# Patient Record
Sex: Male | Born: 1990 | Race: White | Hispanic: No | Marital: Single | Smoking: Never smoker
Health system: Southern US, Community
[De-identification: ages and names within clinical notes are randomized; demographics above are authoritative.]

## PROBLEM LIST (undated history)

## (undated) DIAGNOSIS — N135 Crossing vessel and stricture of ureter without hydronephrosis: Secondary | ICD-10-CM

## (undated) HISTORY — PX: KIDNEY SURGERY: SHX687

## (undated) HISTORY — PX: WISDOM TOOTH EXTRACTION: SHX21

---
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
## 2010-12-20 ENCOUNTER — Other Ambulatory Visit (HOSPITAL_COMMUNITY): Payer: Self-pay | Admitting: Urology

## 2010-12-20 DIAGNOSIS — N135 Crossing vessel and stricture of ureter without hydronephrosis: Secondary | ICD-10-CM

## 2010-12-27 ENCOUNTER — Inpatient Hospital Stay (HOSPITAL_COMMUNITY)
Admission: RE | Admit: 2010-12-27 | Discharge: 2010-12-27 | Payer: Self-pay | Source: Ambulatory Visit | Attending: Urology | Admitting: Urology

## 2011-01-01 ENCOUNTER — Ambulatory Visit (HOSPITAL_COMMUNITY)
Admission: RE | Admit: 2011-01-01 | Discharge: 2011-01-01 | Disposition: A | Discharge status: Home / Self Care | Payer: BC Managed Care – PPO | Source: Ambulatory Visit | Attending: Urology | Admitting: Urology

## 2011-01-01 ENCOUNTER — Encounter (HOSPITAL_COMMUNITY): Payer: Self-pay

## 2011-01-01 DIAGNOSIS — N135 Crossing vessel and stricture of ureter without hydronephrosis: Secondary | ICD-10-CM | POA: Insufficient documentation

## 2011-01-01 HISTORY — DX: Crossing vessel and stricture of ureter without hydronephrosis: N13.5

## 2011-01-01 MED ORDER — TECHNETIUM TC 99M MERTIATIDE
14.7000 | Freq: Once | INTRAVENOUS | Status: AC | PRN
  Administered 2011-01-01: 14.7 via INTRAVENOUS

## 2011-01-01 MED ORDER — FUROSEMIDE 10 MG/ML IJ SOLN: 33.0000 mg/kg | Freq: Once | INTRAMUSCULAR | Status: DC

## 2011-01-30 ENCOUNTER — Ambulatory Visit (HOSPITAL_COMMUNITY)
Admission: RE | Admit: 2011-01-30 | Discharge: 2011-01-30 | Disposition: A | Discharge status: Home / Self Care | Payer: BC Managed Care – PPO | Source: Ambulatory Visit | Attending: Urology | Admitting: Urology

## 2011-01-30 ENCOUNTER — Encounter (HOSPITAL_COMMUNITY): Payer: BC Managed Care – PPO

## 2011-01-30 ENCOUNTER — Other Ambulatory Visit: Payer: Self-pay | Admitting: Urology

## 2011-01-30 ENCOUNTER — Other Ambulatory Visit: Payer: Self-pay

## 2011-01-30 ENCOUNTER — Other Ambulatory Visit (HOSPITAL_COMMUNITY): Payer: Self-pay | Admitting: Urology

## 2011-01-30 DIAGNOSIS — Z01818 Encounter for other preprocedural examination: Secondary | ICD-10-CM | POA: Insufficient documentation

## 2011-01-30 DIAGNOSIS — N135 Crossing vessel and stricture of ureter without hydronephrosis: Secondary | ICD-10-CM | POA: Insufficient documentation

## 2011-01-30 DIAGNOSIS — I10 Essential (primary) hypertension: Secondary | ICD-10-CM | POA: Insufficient documentation

## 2011-01-30 DIAGNOSIS — Z01810 Encounter for preprocedural cardiovascular examination: Secondary | ICD-10-CM | POA: Insufficient documentation

## 2011-01-30 DIAGNOSIS — Z01811 Encounter for preprocedural respiratory examination: Secondary | ICD-10-CM | POA: Insufficient documentation

## 2011-01-30 DIAGNOSIS — Z01812 Encounter for preprocedural laboratory examination: Secondary | ICD-10-CM | POA: Insufficient documentation

## 2011-01-30 LAB — CBC
MCH: 31.1 pg (ref 26.0–34.0)
MCHC: 34 g/dL (ref 30.0–36.0)
RDW: 12.3 % (ref 11.5–15.5)

## 2011-01-30 LAB — BASIC METABOLIC PANEL
CO2: 28 mEq/L (ref 19–32)
Calcium: 10.4 mg/dL (ref 8.4–10.5)
Creatinine, Ser: 1.16 mg/dL (ref 0.4–1.5)
GFR calc Af Amer: >60 mL/min (ref >60)
GFR calc non Af Amer: >60 mL/min (ref >60)
Sodium: 142 mEq/L (ref 135–145)

## 2011-01-30 LAB — TYPE AND SCREEN: Antibody Screen: NEGATIVE

## 2011-01-31 ENCOUNTER — Inpatient Hospital Stay (HOSPITAL_COMMUNITY): Payer: BC Managed Care – PPO

## 2011-01-31 ENCOUNTER — Other Ambulatory Visit: Payer: Self-pay | Admitting: Urology

## 2011-01-31 ENCOUNTER — Inpatient Hospital Stay (HOSPITAL_COMMUNITY)
Admission: RE | Admit: 2011-01-31 | Discharge: 2011-02-01 | DRG: 304 | Disposition: A | Discharge status: Home / Self Care | Payer: BC Managed Care – PPO | Source: Ambulatory Visit | Attending: Urology | Admitting: Urology

## 2011-01-31 DIAGNOSIS — N1330 Unspecified hydronephrosis: Secondary | ICD-10-CM | POA: Diagnosis present

## 2011-01-31 DIAGNOSIS — N135 Crossing vessel and stricture of ureter without hydronephrosis: Principal | ICD-10-CM | POA: Diagnosis present

## 2011-01-31 DIAGNOSIS — I10 Essential (primary) hypertension: Secondary | ICD-10-CM | POA: Diagnosis present

## 2011-01-31 LAB — BASIC METABOLIC PANEL
BUN: 17 mg/dL (ref 6–23)
Chloride: 105 mEq/L (ref 96–112)
GFR calc non Af Amer: >60 mL/min (ref >60)
Glucose, Bld: 120 mg/dL — ABNORMAL HIGH (ref 70–99)
Potassium: 4.1 mEq/L (ref 3.5–5.1)
Sodium: 140 mEq/L (ref 135–145)

## 2011-01-31 LAB — HEMOGLOBIN AND HEMATOCRIT, BLOOD
HCT: 42.5 % (ref 39.0–52.0)
Hemoglobin: 14.8 g/dL (ref 13.0–17.0)

## 2011-02-01 LAB — BASIC METABOLIC PANEL
Calcium: 9.1 mg/dL (ref 8.4–10.5)
GFR calc Af Amer: >60 mL/min (ref >60)
GFR calc non Af Amer: >60 mL/min (ref >60)
Potassium: 4.8 mEq/L (ref 3.5–5.1)
Sodium: 139 mEq/L (ref 135–145)

## 2011-02-01 LAB — HEMOGLOBIN AND HEMATOCRIT, BLOOD: Hemoglobin: 13.7 g/dL (ref 13.0–17.0)

## 2011-02-01 LAB — CREATININE, FLUID (PLEURAL, PERITONEAL, JP DRAINAGE)

## 2011-02-03 NOTE — Op Note (Signed)
[name], Andrade NO.:  [id number]  MEDICAL RECORD NO.:  [id number]           PATIENT TYPE:  I  LOCATION:  1408                         FACILITY:  [facility]  PHYSICIAN:  Heloise Purpura, MD      DATE OF BIRTH:  02/20/1991  DATE OF PROCEDURE:  01/31/2011 DATE OF DISCHARGE:                              OPERATIVE REPORT   PREOPERATIVE DIAGNOSIS:  Right ureteropelvic junction obstruction.  POSTOPERATIVE DIAGNOSIS:  Right ureteropelvic junction obstruction.  PROCEDURES: 1. Cystoscopy. 2. Right retrograde pyelography with interpretation. 3. Right ureteral stent (6 x 24). 4. Right robotic-assisted laparoscopic dismembered pyeloplasty.  SURGEON:  Heloise Purpura, MD  ASSISTANT:  Delia Chimes, NP-C  ANESTHESIA:  General.  COMPLICATIONS:  None.  ESTIMATED BLOOD LOSS:  25 cc.  INTRAVENOUS FLUIDS:  2 L of lactated Ringer's.  SPECIMENS:  Right ureteropelvic junction.  DISPOSITION:  Specimen to Pathology.  DRAINS:1. #15 Blake perinephric drain. 2. 14-French Foley catheter.  INTRAOPERATIVE FINDINGS:  Retrograde pyelography demonstrated a normal caliber ureter without obvious evidence for filling defects or other abnormalities.  There was noted be a tortuous ureteropelvic junction, consistent with a probable crossing lower pole renal vessel.  He also had dilation of the renal pelvis and calyces, consistent with his chronic hydronephrosis.  No filling defects within the renal collecting system were identified.  Intraoperatively, the patient was noted to have a lower pole crossing renal vessel as the apparent etiology for his ureteropelvic junction obstruction.  INDICATION:  Andrade Andrade is a 20 year old male patient who has a history of intermittent right-sided flank pain and underwent an evaluation including a CT scan which demonstrated right-sided hydronephrosis without a dilated ureter and no evidence of urolithiasis.  His findings were most consistent with the  ureteropelvic junction obstruction and he underwent further evaluation with a nuclear medicine renal scan which helped to confirm his diagnosis and also demonstrated decreased relative renal function with approximately 17% split renal function from the right kidney.  I had a thorough discussion with him regarding management options for treatment.  We did discuss the possibility of nephrectomy versus pyeloplasty.  Considering his young age and the family history of end-stage renal disease, he did elect to undergo nephron preservation therapy and a robotic pyeloplasty.  We, therefore, reviewed the potential risks and complications associated with the above procedures and he agreed to proceed and gave his informed consent.  DESCRIPTION OF PROCEDURE:  The patient was taken to the operating room and a general anesthetic was administered.  He was given preoperative antibiotics, placed in the dorsal lithotomy position, and prepped and draped in the usual sterile fashion.  Next, a preoperative time-out was performed.  Cystourethroscopy revealed a normal anterior and posterior urethra.  Systematic examination of bladder revealed the ureteral orifices to be in the normal anatomic position and effluxing clear urine.  Specifically, no evidence of any bladder tumors, stones, or other mucosal pathology was identified.  The right ureteral orifice was then identified and intubated with a 6-French ureteral catheter. Omnipaque contrast was injected with findings dictated above.  He did appear to have a crossing renal vessels, the most likely etiology  for his ureteropelvic junction.  A 0.038 sensor guidewire was then advanced easily into the renal pelvis under fluoroscopic guidance.  An attempt was then made to pass an 8 x 26 double-J ureteral stent.  However, this was unsuccessful due to the narrow caliber of his distal ureter. Therefore, a 7 x 26 double-J ureteral stent was advanced over the wire with an  attempt to place a stent 1 size smaller.  Again, the patient's ureter was of such a small caliber that this stent would not pass easily.  Finally, a 6 x 26 double-J ureteral stent was advanced over the wire and passed easily into the renal pelvis without any resistance.  It was positioned appropriately under fluoroscopic and cystoscopic guidance and the wire was then removed with a good curl noted in the renal pelvis as well as in the bladder.  A 14-French Foley catheter was then placed and the patient was repositioned in the right modified flank position with care to pad all potential pressure points.  Another preoperative time-out was performed.  His abdomen was prepped and draped in the usual sterile fashion.  Site was then selected just to the right of the umbilicus for placement of the camera port which was placed using a standard open Hasson technique.  This allowed entry into the peritoneal cavity under direct vision without difficulty.  Inspection of the abdomen revealed no evidence of any intra-abdominal injuries or other abnormalities.  The remaining ports were then placed.  An 8 mm robotic ports were placed in the right upper quadrant, right lower quadrant, and far right lower quadrant.  A 12 mm port was placed in the upper midline just superior to the umbilicus for placement for laparoscopic assistance.  All ports were placed under direct vision and again without difficulty.  The surgical cart was then docked.  The ascending colon was then reflected medially and the space between the mesocolon and the anterior layer of Gerota's fascia was developed.  The patient did not have very much retroperitoneal fat and the ureter was immediately identified and able to be lifted anteriorly off the psoas muscle.  The ureter was then dissected free in a superior fashion and lower pole renal vessels were identified crossing anterior to the renal pelvis at the level of the ureteropelvic  junction.  Using a combination of blunt and sharp dissection, the proximal ureter and renal pelvis was able to be dissected free from the surrounding perinephric fat with care to preserve the periureteral blood supply.  Once the renal pelvis and ureter was freed from the overlying renal vessels, the ureter was divided just below the level of the ureteropelvic junction and the stent was brought into the operative field.  The renal pelvis was then brought anterior and transposed over the crossing lower pole renal vessels.  The ureteropelvic junction was then excised and sent for pathologic analysis.  This resulted in a widely patent and laterally spatulated renal pelvis.  The ureter appeared normal and with excellent blood supply.  It was spatulated medially in appropriate length.  A 4-0 Vicryl interrupted sutures were then placed at the lateral medial aspects of the renal pelvis and used to reapproximate the ureter to the renal pelvis in a fashion, resulting in dependent drainage.  The stent was placed back into the renal pelvis and running 4-0 Vicryl sutures were then used to complete the anastomosis both posteriorly and anteriorly. This appeared to result in a watertight and tension-free anastomosis with the ureter placed again in  a dependent portion of the renal pelvis. Hemostasis appeared excellent even with a pneumoperitoneum let down.  A #15 Blake drain was then brought through the far right lower quadrant port and positioned within the perinephric space.  It was secured to the skin with a nylon suture.  All remaining ports were then examined and the 12 mm port sites were closed with 0 Vicryl sutures placed laparoscopically.  The remaining ports were then removed under direct vision and the pneumoperitoneum was let down.  All port sites were then injected with 0.25% Marcaine and reapproximated at the skin level with 4- 0 Monocryl subcuticular closures.  Dermabond was applied to the  skin. He tolerated the procedure well without complications.  He was able to be extubated and transferred to the recovery unit in satisfactory condition.     Heloise Purpura, MD     LB/MEDQ  D:  01/31/2011  T:  01/31/2011  Job:  [id number]  Electronically Signed by Heloise Purpura MD on 02/03/2011 04:43:33 PM

## 2011-02-21 LAB — ABO/RH: ABO/RH(D): O POS

## 2011-06-07 ENCOUNTER — Other Ambulatory Visit (HOSPITAL_COMMUNITY): Payer: Self-pay | Admitting: Urology

## 2011-07-10 ENCOUNTER — Other Ambulatory Visit (HOSPITAL_COMMUNITY): Payer: BC Managed Care – PPO

## 2013-04-22 ENCOUNTER — Other Ambulatory Visit: Payer: Self-pay | Admitting: Dermatology

## 2013-05-10 ENCOUNTER — Other Ambulatory Visit: Payer: Self-pay | Admitting: Dermatology

## 2014-04-27 ENCOUNTER — Other Ambulatory Visit: Payer: Self-pay | Admitting: Dermatology

## 2014-09-02 ENCOUNTER — Other Ambulatory Visit (HOSPITAL_COMMUNITY): Payer: Self-pay | Admitting: Urology

## 2014-09-02 DIAGNOSIS — Q6239 Other obstructive defects of renal pelvis and ureter: Secondary | ICD-10-CM

## 2014-09-02 DIAGNOSIS — Q6211 Congenital occlusion of ureteropelvic junction: Principal | ICD-10-CM

## 2014-09-13 ENCOUNTER — Ambulatory Visit (HOSPITAL_COMMUNITY): Admission: RE | Admit: 2014-09-13 | Payer: BC Managed Care – PPO | Source: Ambulatory Visit

## 2014-09-15 ENCOUNTER — Encounter (HOSPITAL_COMMUNITY)
Admission: RE | Admit: 2014-09-15 | Discharge: 2014-09-15 | Disposition: A | Discharge status: Home / Self Care | Payer: BC Managed Care – PPO | Source: Ambulatory Visit | Attending: Urology | Admitting: Urology

## 2014-09-15 DIAGNOSIS — Q6211 Congenital occlusion of ureteropelvic junction: Secondary | ICD-10-CM | POA: Diagnosis not present

## 2014-09-15 DIAGNOSIS — Q6239 Other obstructive defects of renal pelvis and ureter: Secondary | ICD-10-CM

## 2014-09-15 MED ORDER — FUROSEMIDE 10 MG/ML IJ SOLN
40.0000 mg | Freq: Once | INTRAMUSCULAR | Status: AC
  Administered 2014-09-15: 40 mg via INTRAVENOUS
  Filled 2014-09-15: qty 4

## 2014-09-15 MED ORDER — TECHNETIUM TC 99M MERTIATIDE
15.5000 | Freq: Once | INTRAVENOUS | Status: AC | PRN
  Administered 2014-09-15: 15.5 via INTRAVENOUS

## 2016-06-01 ENCOUNTER — Emergency Department (HOSPITAL_BASED_OUTPATIENT_CLINIC_OR_DEPARTMENT_OTHER)
Admission: EM | Admit: 2016-06-01 | Discharge: 2016-06-01 | Disposition: A | Discharge status: Home / Self Care | Payer: Self-pay | Attending: Emergency Medicine | Admitting: Emergency Medicine

## 2016-06-01 ENCOUNTER — Emergency Department (HOSPITAL_BASED_OUTPATIENT_CLINIC_OR_DEPARTMENT_OTHER): Payer: Self-pay

## 2016-06-01 ENCOUNTER — Encounter (HOSPITAL_BASED_OUTPATIENT_CLINIC_OR_DEPARTMENT_OTHER): Payer: Self-pay | Admitting: Emergency Medicine

## 2016-06-01 DIAGNOSIS — R319 Hematuria, unspecified: Secondary | ICD-10-CM

## 2016-06-01 LAB — BASIC METABOLIC PANEL
Anion gap: 6 (ref 5–15)
BUN: 16 mg/dL (ref 6–20)
CALCIUM: 9.8 mg/dL (ref 8.9–10.3)
CO2: 28 mmol/L (ref 22–32)
CREATININE: 0.85 mg/dL (ref 0.61–1.24)
Chloride: 106 mmol/L (ref 101–111)
GFR calc non Af Amer: >60 mL/min (ref >60)
Glucose, Bld: 99 mg/dL (ref 65–99)
Potassium: 3.5 mmol/L (ref 3.5–5.1)
SODIUM: 140 mmol/L (ref 135–145)

## 2016-06-01 LAB — URINE MICROSCOPIC-ADD ON

## 2016-06-01 LAB — CBC WITH DIFFERENTIAL/PLATELET
BASOS PCT: 0 %
Basophils Absolute: 0 10*3/uL (ref 0.0–0.1)
EOS ABS: 0.2 10*3/uL (ref 0.0–0.7)
EOS PCT: 2 %
HCT: 42.4 % (ref 39.0–52.0)
HEMOGLOBIN: 14.7 g/dL (ref 13.0–17.0)
Lymphocytes Relative: 22 %
Lymphs Abs: 2.4 10*3/uL (ref 0.7–4.0)
MCH: 32.3 pg (ref 26.0–34.0)
MCHC: 34.7 g/dL (ref 30.0–36.0)
MCV: 93.2 fL (ref 78.0–100.0)
MONOS PCT: 12 %
Monocytes Absolute: 1.2 10*3/uL — ABNORMAL HIGH (ref 0.1–1.0)
NEUTROS PCT: 64 %
Neutro Abs: 6.7 10*3/uL (ref 1.7–7.7)
PLATELETS: 253 10*3/uL (ref 150–400)
RBC: 4.55 MIL/uL (ref 4.22–5.81)
RDW: 12.5 % (ref 11.5–15.5)
WBC: 10.6 10*3/uL — AB (ref 4.0–10.5)

## 2016-06-01 LAB — URINALYSIS, ROUTINE W REFLEX MICROSCOPIC
BILIRUBIN URINE: NEGATIVE
Glucose, UA: NEGATIVE mg/dL
KETONES UR: 15 mg/dL — AB
NITRITE: NEGATIVE
Protein, ur: 100 mg/dL — AB
Specific Gravity, Urine: 1.015 (ref 1.005–1.030)
pH: 6.5 (ref 5.0–8.0)

## 2016-06-01 NOTE — ED Provider Notes (Signed)
CSN: [id number]     Arrival date & time 06/01/16  7846 History   First MD Initiated Contact with Patient 06/01/16 1004     Chief Complaint  Patient presents with  . Flank Pain      Patient is a 25 y.o. male presenting with flank pain. The history is provided by the patient.  Flank Pain This is a new problem. Pertinent negatives include no chest pain, no abdominal pain, no headaches and no shortness of breath.  Patient presents with hematuria and right flank pain. Began this morning. The pain is dull. States he is not having difficulty urinating. Previous history of right-sided UPJ obstruction. States that repaired a few years ago with surgery. States this feels somewhat the same but not as severe. He did not have hematuria with that in the past. He states that the right kidney does about 25% of his kidney function.  Past Medical History  Diagnosis Date  . UPJ obstruction, acquired    Past Surgical History  Procedure Laterality Date  . Kidney surgery Right    No family history on file. Social History  Substance Use Topics  . Smoking status: Never Smoker   . Smokeless tobacco: None  . Alcohol Use: Yes    Review of Systems  Constitutional: Negative for activity change and appetite change.  HENT: Negative for congestion.   Eyes: Negative for pain.  Respiratory: Negative for chest tightness and shortness of breath.   Cardiovascular: Negative for chest pain and leg swelling.  Gastrointestinal: Negative for nausea, vomiting, abdominal pain and diarrhea.  Genitourinary: Positive for hematuria and flank pain.  Musculoskeletal: Negative for back pain and neck stiffness.  Skin: Negative for rash.  Neurological: Negative for weakness, numbness and headaches.  Psychiatric/Behavioral: Negative for behavioral problems.      Allergies  Penicillins  Home Medications   Prior to Admission medications   Not on File   BP 126/96 mmHg  Pulse 84  Temp(Src) 97.5 F (36.4 C) (Oral)   Resp 16  Ht  (1.753 m)  Wt 170 lb (77.111 kg)  BMI 25.09 kg/m2  SpO2 100% Physical Exam  Constitutional: He is oriented to person, place, and time. He appears well-developed and well-nourished.  HENT:  Head: Normocephalic and atraumatic.  Eyes: Pupils are equal, round, and reactive to light.  Cardiovascular: Normal rate, regular rhythm and normal heart sounds.   No murmur heard. Pulmonary/Chest: Effort normal and breath sounds normal.  Abdominal: Soft. Bowel sounds are normal. He exhibits no distension and no mass. There is no tenderness. There is no rebound and no guarding.  Genitourinary:  No CVA tenderness.  Musculoskeletal: Normal range of motion. He exhibits no edema.  Neurological: He is alert and oriented to person, place, and time. No cranial nerve deficit.  Skin: Skin is warm and dry.  Psychiatric: He has a normal mood and affect.  Nursing note and vitals reviewed.   ED Course  Procedures (including critical care time) Labs Review Labs Reviewed  URINALYSIS, ROUTINE W REFLEX MICROSCOPIC (NOT AT [facility]) - Abnormal; Notable for the following:    Color, Urine RED (*)    APPearance CLOUDY (*)    Hgb urine dipstick LARGE (*)    Ketones, ur 15 (*)    Protein, ur 100 (*)    Leukocytes, UA SMALL (*)    All other components within normal limits  CBC WITH DIFFERENTIAL/PLATELET - Abnormal; Notable for the following:    WBC 10.6 (*)    Monocytes  Absolute 1.2 (*)    All other components within normal limits  URINE MICROSCOPIC-ADD ON - Abnormal; Notable for the following:    Squamous Epithelial / LPF 0-5 (*)    Bacteria, UA RARE (*)    All other components within normal limits  BASIC METABOLIC PANEL    Imaging Review Ct Renal Stone Study  06/01/2016  CLINICAL DATA:  Right flank pain and hematuria EXAM: CT ABDOMEN AND PELVIS WITHOUT CONTRAST TECHNIQUE: Multidetector CT imaging of the abdomen and pelvis was performed following the standard protocol without IV contrast.  COMPARISON:  None FINDINGS: Lower chest: The lung bases are clear. No pleural or pericardial effusion. Hepatobiliary: No focal liver abnormality identified. Gallbladder appears normal. No biliary dilatation. Pancreas: The pancreas is unremarkable. Spleen: The spleen is negative. Adrenals/Urinary Tract: Normal appearance of the adrenal glands. Numerous bilateral renal cysts of varying density are identified and incompletely characterized without IV contrast. There is asymmetric cortical scarring and atrophy of the right kidney. Tiny bilateral renal calculi are noted. No hydronephrosis or hydroureter identified. No ureteral lithiasis identified. The urinary bladder appears normal. Stomach/Bowel: The stomach appears normal. The small bowel loops have a normal course and caliber without evidence for bowel obstruction. The colon is unremarkable. The appendix is visualized and appears normal. Vascular/Lymphatic: Normal appearance of the abdominal aorta. No enlarged retroperitoneal or mesenteric adenopathy. No enlarged pelvic or inguinal lymph nodes. Reproductive: No mass or other significant abnormality. Other: None. Musculoskeletal: No aggressive lytic or sclerotic bone lesions identified. 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 IMPRESSION: 1. No acute findings identified within the abdomen or pelvis. 2. Asymmetric right renal cortical atrophy and scarring. 3. Bilateral renal cysts of varying density are identified an incompletely characterized without IV contrast. 4. Tiny bilateral renal calculi identified. No hydronephrosis or hydroureter identified. Electronically Signed   By: Signa Kellaylor  Stroud M.D.   On: 06/01/2016 13:13   I have personally reviewed and evaluated these images and lab results as part of my medical decision-making.   EKG Interpretation None      MDM   Final diagnoses:  Hematuria    Patient with hematuria. 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  Labs reassuring. CT scan done and did not show a clear cause but does have some bilateral kidney stones. Will discharge home to follow with his urologist.    Benjiman CoreNathan Aaliah Jorgenson, MD 06/01/16 1425

## 2016-06-01 NOTE — ED Notes (Signed)
R flank pain, non radiating started this morning. Pt also reports hematuria. Denies N/V.

## 2016-06-01 NOTE — Discharge Instructions (Signed)

## 2017-11-19 IMAGING — CT CT RENAL STONE PROTOCOL
2 of 4 series · 16 of 46 positions shown, 18 images · non-contrast
Comparison: None

CLINICAL DATA: Right flank pain and hematuria

EXAM:
CT ABDOMEN AND PELVIS WITHOUT CONTRAST
TECHNIQUE: Multidetector CT imaging of the abdomen and pelvis was performed
following the standard protocol without IV contrast.

[Series 2: axial st · axial · 0.74mm/px · z∈[+637,+1057]mm · 13 of 92 slices shown, 15 images]
[im 4/92  soft-tissue]
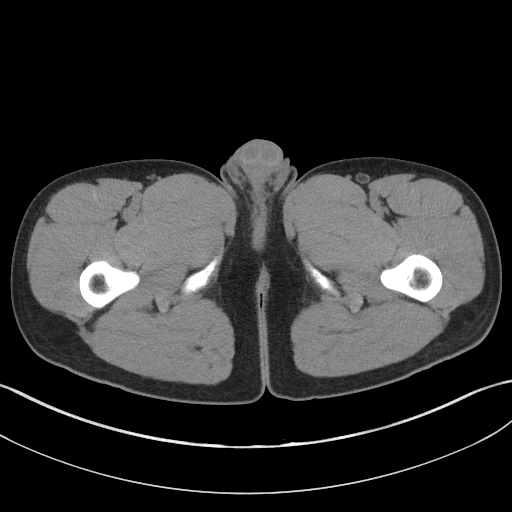
[im 4/92  bone]
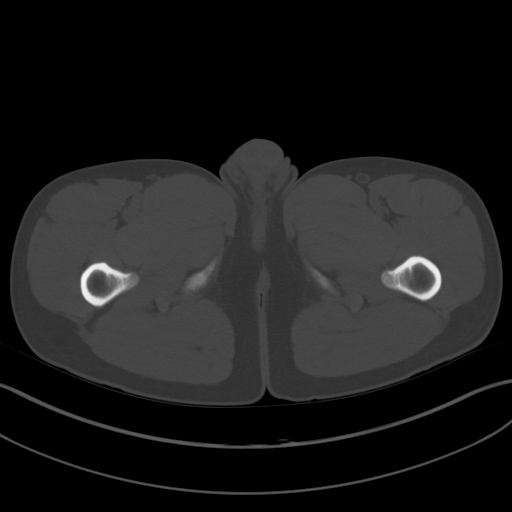
[im 12/92  soft-tissue]
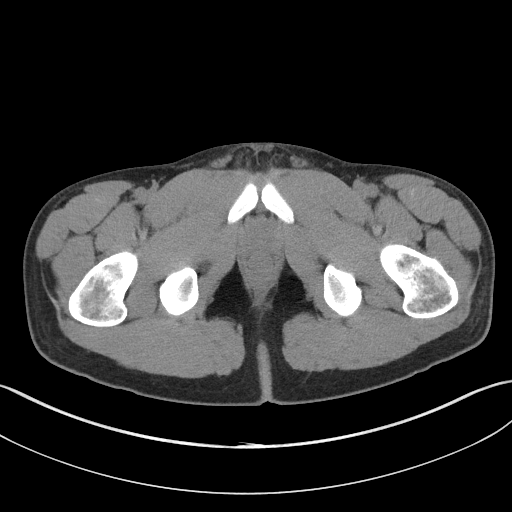
[im 19/92  soft-tissue]
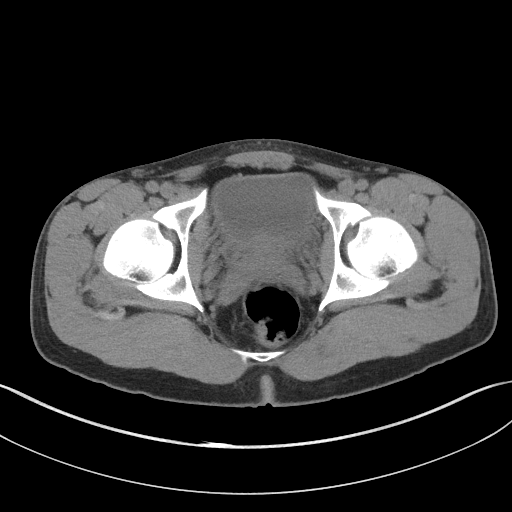
[im 27/92  soft-tissue]
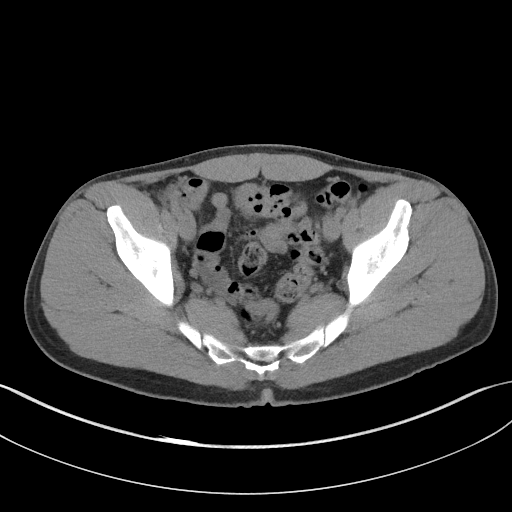
[im 31/92  soft-tissue]
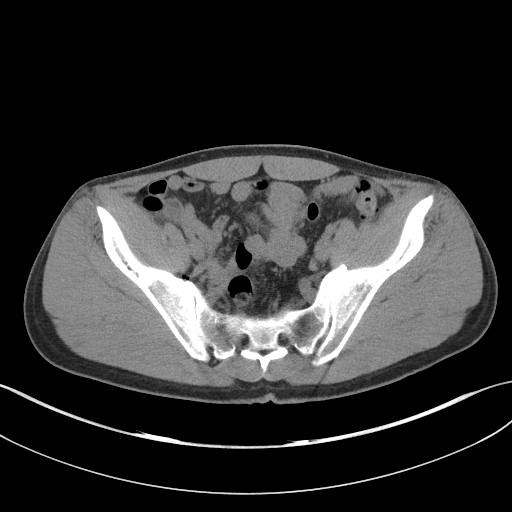
[im 38/92  soft-tissue]
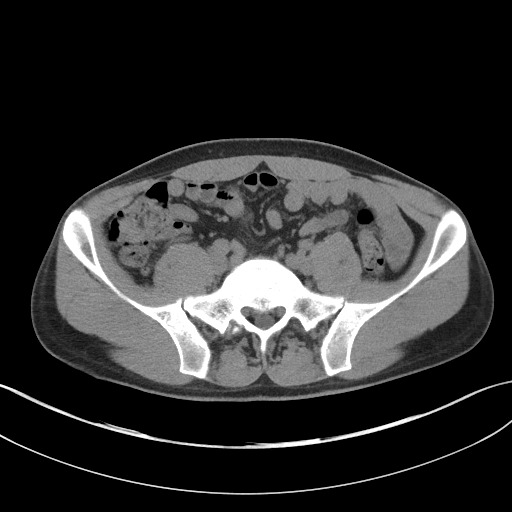
[im 46/92  soft-tissue]
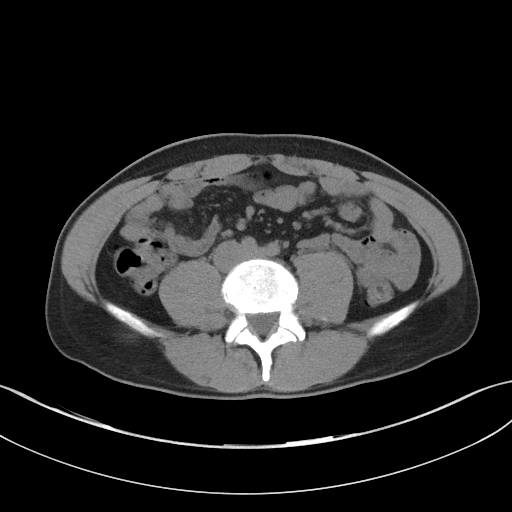
[im 54/92  soft-tissue]
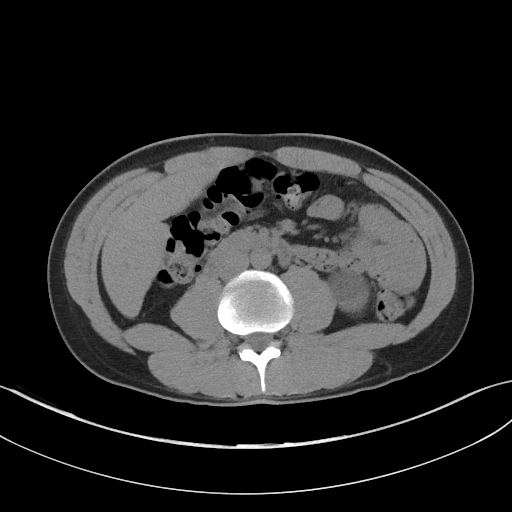
[im 61/92  soft-tissue]
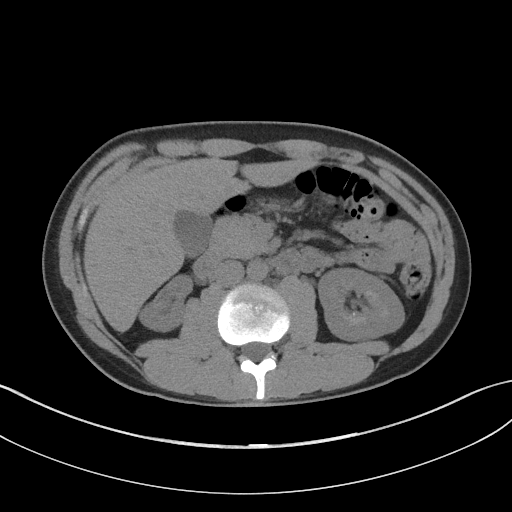
[im 61/92  bone]
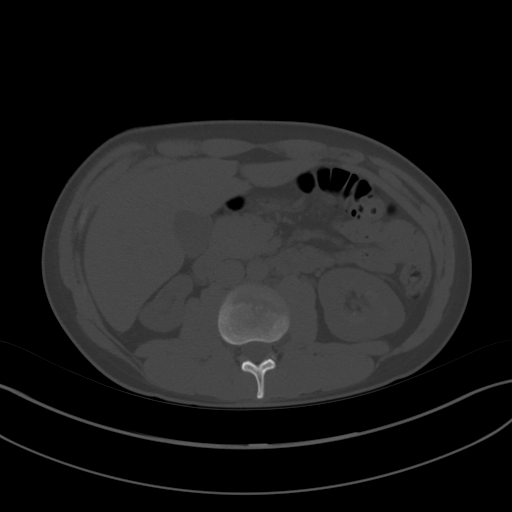
[im 65/92  soft-tissue]
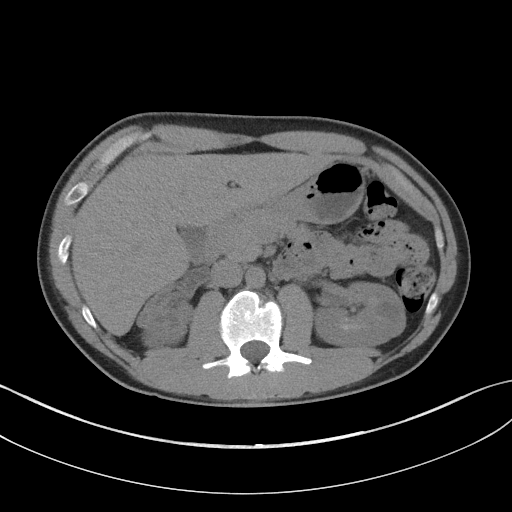
[im 73/92  soft-tissue]
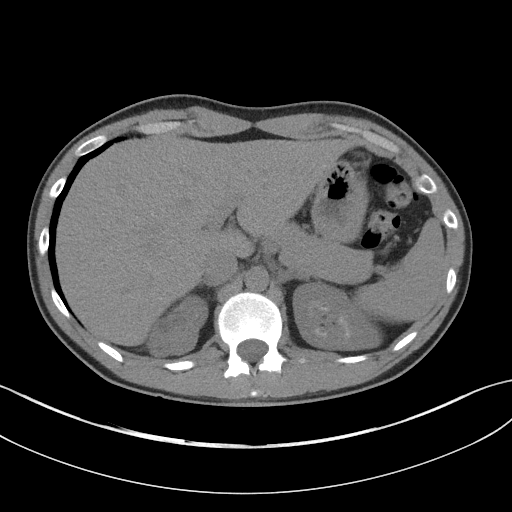
[im 80/92  soft-tissue]
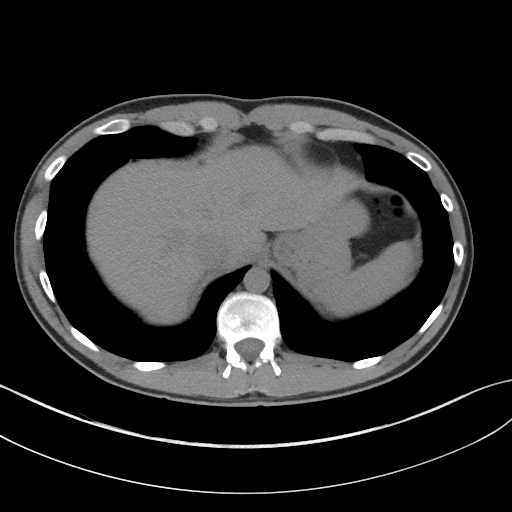
[im 88/92  soft-tissue]
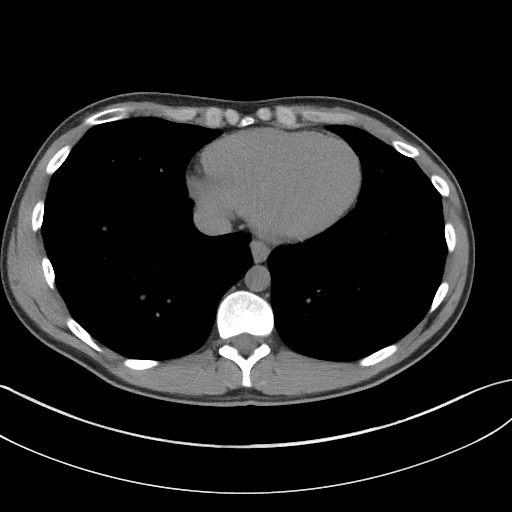

[Series 5: coronal st · coronal · 0.71mm/px · 3 of 72 slices shown]
[im 24/72  soft-tissue]
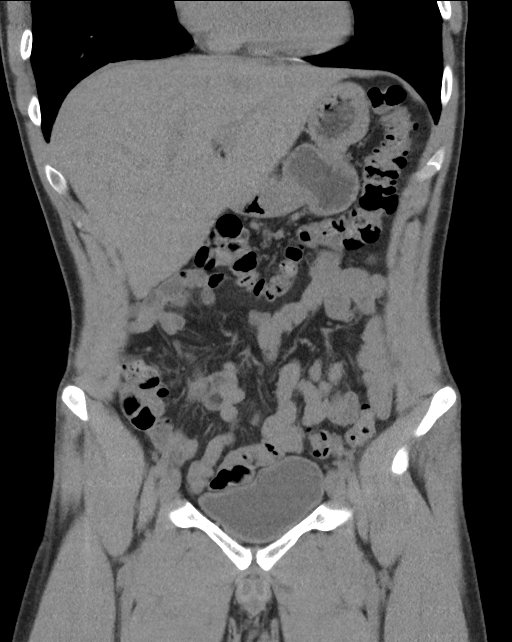
[im 32/72  soft-tissue]
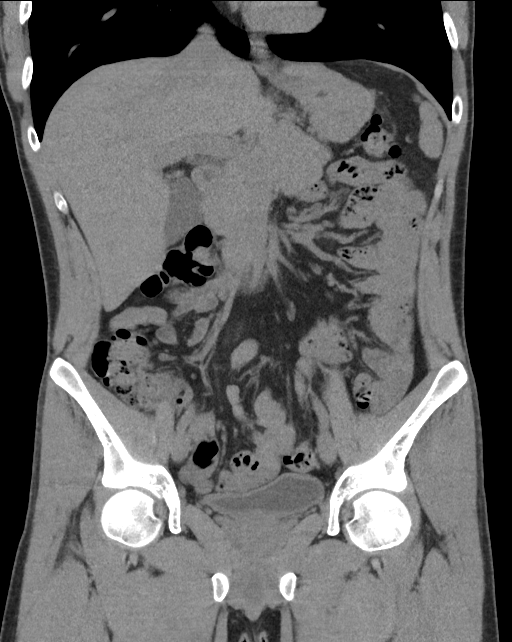
[im 40/72  soft-tissue]
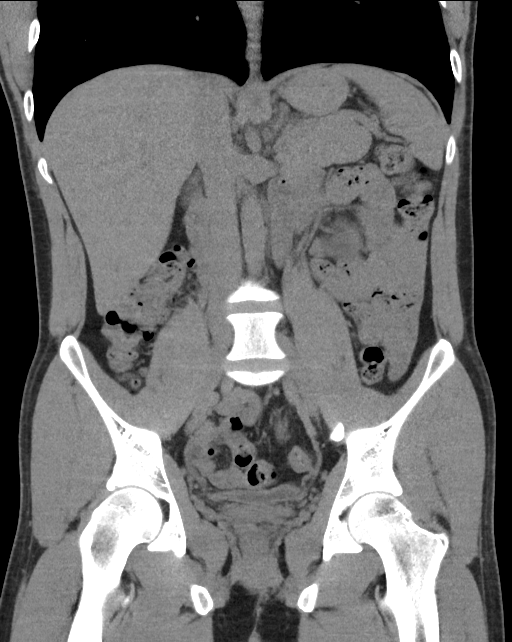

[16 of 46 positions shown; findings below may reference images not displayed]

FINDINGS: Lower chest: The lung bases are clear. No pleural or pericardial
effusion.

Hepatobiliary: No focal liver abnormality identified. Gallbladder
appears normal. No biliary dilatation.

Pancreas: The pancreas is unremarkable.

Spleen: The spleen is negative.

Adrenals/Urinary Tract: Normal appearance of the adrenal glands.
Numerous bilateral renal cysts of varying density are identified and
incompletely characterized without IV contrast. There is asymmetric
cortical scarring and atrophy of the right kidney. Tiny bilateral
renal calculi are noted. No hydronephrosis or hydroureter
identified. No ureteral lithiasis identified. The urinary bladder
appears normal.

Stomach/Bowel: The stomach appears normal. The small bowel loops
have a normal course and caliber without evidence for bowel
obstruction. The colon is unremarkable. The appendix is visualized
and appears normal.

Vascular/Lymphatic: Normal appearance of the abdominal aorta. No
enlarged retroperitoneal or mesenteric adenopathy. No enlarged
pelvic or inguinal lymph nodes.

Reproductive: No mass or other significant abnormality.

Other: None.

Musculoskeletal: No aggressive lytic or sclerotic bone lesions
identified. Multiple Schmorl snow deformities are identified within
the lumbar spine.
IMPRESSION: 1. No acute findings identified within the abdomen or pelvis.
2. Asymmetric right renal cortical atrophy and scarring.
3. Bilateral renal cysts of varying density are identified an
incompletely characterized without IV contrast.
4. Tiny bilateral renal calculi identified. No hydronephrosis or
hydroureter identified.

## 2020-01-25 ENCOUNTER — Ambulatory Visit
Admission: EM | Admit: 2020-01-25 | Discharge: 2020-01-25 | Disposition: A | Discharge status: Home / Self Care | Payer: BC Managed Care – PPO | Attending: Emergency Medicine | Admitting: Emergency Medicine

## 2020-01-25 DIAGNOSIS — R369 Urethral discharge, unspecified: Secondary | ICD-10-CM

## 2020-01-25 DIAGNOSIS — Z7251 High risk heterosexual behavior: Secondary | ICD-10-CM

## 2020-01-25 DIAGNOSIS — R300 Dysuria: Secondary | ICD-10-CM

## 2020-01-25 LAB — POCT URINALYSIS DIP (MANUAL ENTRY)
Bilirubin, UA: NEGATIVE
Glucose, UA: NEGATIVE mg/dL
Ketones, POC UA: NEGATIVE mg/dL
Nitrite, UA: NEGATIVE
Protein Ur, POC: NEGATIVE mg/dL
Spec Grav, UA: 1.02 (ref 1.010–1.025)
Urobilinogen, UA: 0.2 E.U./dL
pH, UA: 6 (ref 5.0–8.0)

## 2020-01-25 MED ORDER — DOXYCYCLINE HYCLATE 100 MG PO CAPS: 100.0000 mg | ORAL_CAPSULE | Freq: Two times a day (BID) | ORAL | 0 refills | Status: AC

## 2020-01-25 MED ORDER — GEMIFLOXACIN MESYLATE 320 MG PO TABS: 320.0000 mg | ORAL_TABLET | Freq: Once | ORAL | 0 refills | Status: AC

## 2020-01-25 NOTE — ED Triage Notes (Signed)
Pt c/o burning at the end of urination and penile discharge x2 days

## 2020-01-25 NOTE — Discharge Instructions (Signed)
Today you received treatment for chlamydia, gonorrhea. Testing for chlamydia, gonorrhea, trichomonas is pending: please look for these results on the MyChart app/website.  We will notify you if you are positive and outline treatment at that time.  Important to avoid all forms of sexual intercourse (oral, vaginal, anal) with any/all partners for the next 7 days to avoid spreading/reinfecting. Any/all sexual partners should be notified of testing/treatment today.  Return for persistent/worsening symptoms or if you develop fever, abdominal or pelvic pain, blood in your urine, or are re-exposed to an STI. 

## 2020-01-25 NOTE — ED Provider Notes (Signed)
[facility]    CSN: [id number] Arrival date & time: 01/25/20  1930      History   Chief Complaint Chief Complaint  Patient presents with  . SEXUALLY TRANSMITTED DISEASE    HPI [name] Andrade is a 29 y.o. male presenting for dysuria and of urination, penile discharge for the last 2 days.  Denying penile or testicular pain, swelling, urinary frequency or hematuria.  No abdominal pain, back pain, fever.  Patient is currently sexually active with both men and women.  Currently has more than 1 partners, wears condoms most of the times.  Patient denies history of HIV, syphilis.  Does have history of chlamydia: Last treated last year for this.   Past Medical History:  Diagnosis Date  . UPJ obstruction, acquired     There are no problems to display for this patient.   Past Surgical History:  Procedure Laterality Date  . KIDNEY SURGERY Right        Home Medications    Prior to Admission medications   Medication Sig Start Date End Date Taking? Authorizing Provider  doxycycline (VIBRAMYCIN) 100 MG capsule Take 1 capsule (100 mg total) by mouth 2 (two) times daily for 7 days. 01/25/20 02/01/20  Hall-Potvin, [place], PA-C  gemifloxacin (FACTIVE) 320 MG tablet Take 1 tablet (320 mg total) by mouth once for 1 dose. 01/25/20 01/25/20  Hall-Potvin, [place], PA-C    Family History History reviewed. No pertinent family history.  Social History Social History   Tobacco Use  . Smoking status: Never Smoker  . Smokeless tobacco: Never Used  Substance Use Topics  . Alcohol use: Yes  . Drug use: Yes    Types: Marijuana     Allergies   Penicillins   Review of Systems As per HPI   Physical Exam Triage Vital Signs ED Triage Vitals  Enc Vitals Group     BP      Pulse      Resp      Temp      Temp src      SpO2      Weight      Height      Head Circumference      Peak Flow      Pain Score      Pain Loc      Pain Edu?      Excl. in [place]?    No data  found.  Updated Vital Signs BP (!) 142/92 (BP Location: Left Arm)   Pulse 78   Temp (!) 97.4 F (36.3 C) (Oral)   Resp 16   SpO2 96%   Visual Acuity Right Eye Distance:   Left Eye Distance:   Bilateral Distance:    Right Eye Near:   Left Eye Near:    Bilateral Near:     Physical Exam Constitutional:      General: He is not in acute distress. HENT:     Head: Normocephalic and atraumatic.  Eyes:     General: No scleral icterus.    Pupils: Pupils are equal, round, and reactive to light.  Cardiovascular:     Rate and Rhythm: Normal rate.  Pulmonary:     Effort: Pulmonary effort is normal. No respiratory distress.     Breath sounds: No wheezing.  Genitourinary:    Comments: Patient declined: Self swab performed Skin:    Coloration: Skin is not jaundiced or pale.  Neurological:     Mental Status: He is alert and  oriented to person, place, and time.      UC Treatments / Results  Labs (all labs ordered are listed, but only abnormal results are displayed) Labs Reviewed  POCT URINALYSIS DIP (MANUAL ENTRY) - Abnormal; Notable for the following components:      Result Value   Blood, UA trace-intact (*)    Leukocytes, UA Trace (*)    All other components within normal limits  URINE CULTURE  RPR  HIV ANTIBODY (ROUTINE TESTING W REFLEX)  CYTOLOGY, (ORAL, ANAL, URETHRAL) ANCILLARY ONLY    EKG   Radiology No results found.  Procedures Procedures (including critical care time)  Medications Ordered in UC Medications - No data to display  Initial Impression / Assessment and Plan / UC Course  I have reviewed the triage vital signs and the nursing notes.  Pertinent labs & imaging results that were available during my care of the patient were reviewed by me and considered in my medical decision making (see chart for details).     Patient afebrile, nontoxic in office today.  Patient with high risk sexual behaviors, though denying history of HIV, syphilis.  Serology  for this is pending as well as urethral cytology.  Will treat empirically for chlamydia, gonorrhea as outlined below due to allergic reactions to penicillin and amoxicillin.  Urine dipstick showing trace leukocytes, trace intact blood: Likely second to penile discharge/STI-culture pending.  Will treat if indicated.  Return precautions discussed, patient verbalized understanding and is agreeable to plan. Final Clinical Impressions(s) / UC Diagnoses   Final diagnoses:  Unprotected sex  Penile discharge  Dysuria     Discharge Instructions     Today you received treatment for chlamydia, gonorrhea. Testing for chlamydia, gonorrhea, trichomonas is pending: please look for these results on the MyChart app/website.  We will notify you if you are positive and outline treatment at that time.  Important to avoid all forms of sexual intercourse (oral, vaginal, anal) with any/all partners for the next 7 days to avoid spreading/reinfecting. Any/all sexual partners should be notified of testing/treatment today.  Return for persistent/worsening symptoms or if you develop fever, abdominal or pelvic pain, blood in your urine, or are re-exposed to an STI.    ED Prescriptions    Medication Sig Dispense Auth. Provider   doxycycline (VIBRAMYCIN) 100 MG capsule Take 1 capsule (100 mg total) by mouth 2 (two) times daily for 7 days. 14 capsule Hall-Potvin, [place], PA-C   gemifloxacin (FACTIVE) 320 MG tablet Take 1 tablet (320 mg total) by mouth once for 1 dose. 1 tablet Hall-Potvin, [place], PA-C     PDMP not reviewed this encounter.   Hall-Potvin, [place], [place] 01/25/20 2006

## 2020-01-27 ENCOUNTER — Ambulatory Visit (HOSPITAL_COMMUNITY)
Admission: EM | Admit: 2020-01-27 | Discharge: 2020-01-27 | Disposition: A | Discharge status: Home / Self Care | Payer: BC Managed Care – PPO | Attending: Family Medicine | Admitting: Family Medicine

## 2020-01-27 ENCOUNTER — Other Ambulatory Visit: Payer: Self-pay

## 2020-01-27 ENCOUNTER — Telehealth (HOSPITAL_COMMUNITY): Payer: Self-pay | Admitting: Emergency Medicine

## 2020-01-27 DIAGNOSIS — A549 Gonococcal infection, unspecified: Secondary | ICD-10-CM | POA: Diagnosis not present

## 2020-01-27 DIAGNOSIS — A64 Unspecified sexually transmitted disease: Secondary | ICD-10-CM

## 2020-01-27 LAB — RPR: RPR Ser Ql: NONREACTIVE

## 2020-01-27 LAB — CYTOLOGY, (ORAL, ANAL, URETHRAL) ANCILLARY ONLY
Chlamydia: POSITIVE — AB
Neisseria Gonorrhea: POSITIVE — AB
Trichomonas: NEGATIVE

## 2020-01-27 LAB — URINE CULTURE: Culture: NO GROWTH

## 2020-01-27 LAB — HIV ANTIBODY (ROUTINE TESTING W REFLEX): HIV Screen 4th Generation wRfx: NONREACTIVE

## 2020-01-27 MED ORDER — CEFTRIAXONE SODIUM 500 MG IJ SOLR
INTRAMUSCULAR | Status: AC
  Filled 2020-01-27: qty 500

## 2020-01-27 MED ORDER — CEFTRIAXONE SODIUM 500 MG IJ SOLR
500.0000 mg | Freq: Once | INTRAMUSCULAR | Status: AC
  Administered 2020-01-27: 500 mg via INTRAMUSCULAR

## 2020-01-27 NOTE — Telephone Encounter (Signed)
Gonorrhea and Chlamydia are positive. Pt needs treatment. Please consult with Dr. Delton See or provider in office for appropriate treatment.   Patient contacted by phone and made aware of    results. Pt verbalized understanding and had all questions answered.

## 2020-01-27 NOTE — ED Notes (Signed)
Per Dr. Delton See, okay to given 500mg  IM rocephin. Pt treated previously with doxycycline. Pt here for treatment only.

## 2020-01-27 NOTE — ED Notes (Signed)
No reaction to rocephin injection. Pt discharged.

## 2020-01-27 NOTE — ED Provider Notes (Signed)
Here for injection only Reports a vague allergy to penicillin since child Not hospitalized Will give rocephin and observe   Eustace Moore, MD 01/27/20 1521

## 2021-07-05 ENCOUNTER — Ambulatory Visit
Admission: EM | Admit: 2021-07-05 | Discharge: 2021-07-05 | Disposition: A | Discharge status: Home / Self Care | Payer: BC Managed Care – PPO | Attending: Internal Medicine | Admitting: Internal Medicine

## 2021-07-05 ENCOUNTER — Other Ambulatory Visit: Payer: Self-pay

## 2021-07-05 DIAGNOSIS — Z113 Encounter for screening for infections with a predominantly sexual mode of transmission: Secondary | ICD-10-CM

## 2021-07-05 DIAGNOSIS — R350 Frequency of micturition: Secondary | ICD-10-CM

## 2021-07-05 DIAGNOSIS — M545 Low back pain, unspecified: Secondary | ICD-10-CM | POA: Diagnosis not present

## 2021-07-05 LAB — POCT URINALYSIS DIP (MANUAL ENTRY)
Bilirubin, UA: NEGATIVE
Glucose, UA: NEGATIVE mg/dL
Ketones, POC UA: NEGATIVE mg/dL
Nitrite, UA: NEGATIVE
Protein Ur, POC: NEGATIVE mg/dL
Spec Grav, UA: 1.025 (ref 1.010–1.025)
Urobilinogen, UA: 0.2 E.U./dL
pH, UA: 6.5 (ref 5.0–8.0)

## 2021-07-05 MED ORDER — TAMSULOSIN HCL 0.4 MG PO CAPS: 0.4000 mg | ORAL_CAPSULE | Freq: Every day | ORAL | 0 refills | Status: AC

## 2021-07-05 NOTE — ED Triage Notes (Signed)
Pt c/o lower back pain with urinary frequency x2 days.

## 2021-07-05 NOTE — Discharge Instructions (Addendum)
Your urine culture and STD swab are pending. We will call if this is positive. Please go to the hospital if symptoms worsen.

## 2021-07-05 NOTE — ED Provider Notes (Signed)
[facility]    CSN: [id number] Arrival date & time: 07/05/21  1847      History   Chief Complaint Chief Complaint  Patient presents with   Urinary Tract Infection    HPI [name] Andrade is a 30 y.o. male.   Patient presents to the urgent care for right sided lower back pain and urinary frequency that has been present for 2 days. Denies urinary burning, pelvic pain, abdominal pain, penile discharge or irritation, fever. Denies any known exposure to STD but has had unprotected sexual intercourse recently. 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 States that he has intermittent back pain in same area since surgery but it has worsened over the past two days.    Urinary Tract Infection  Past Medical History:  Diagnosis Date   UPJ obstruction, acquired     There are no problems to display for this patient.   Past Surgical History:  Procedure Laterality Date   KIDNEY SURGERY Right        Home Medications    Prior to Admission medications   Medication Sig Start Date End Date Taking? Authorizing Provider  tamsulosin (FLOMAX) 0.4 MG CAPS capsule Take 1 capsule (0.4 mg total) by mouth daily for 14 days. 07/05/21 07/19/21 Yes Lance Muss, FNP    Family History History reviewed. No pertinent family history.  Social History Social History   Tobacco Use   Smoking status: Never   Smokeless tobacco: Never  Substance Use Topics   Alcohol use: Yes   Drug use: Yes    Types: Marijuana     Allergies   Penicillins   Review of Systems Review of Systems Per HPI  Physical Exam Triage Vital Signs ED Triage Vitals [07/05/21 1852]  Enc Vitals Group     BP (!) 148/94     Pulse Rate 99     Resp 14     Temp 98.2 F (36.8 C)     Temp Source Oral     SpO2 97 %     Weight      Height      Head Circumference      Peak Flow      Pain Score      Pain Loc      Pain Edu?       Excl. in GC?    No data found.  Updated Vital Signs BP (!) 148/94 (BP Location: Left Arm)   Pulse 99   Temp 98.2 F (36.8 C) (Oral)   Resp 14   SpO2 97%   Visual Acuity Right Eye Distance:   Left Eye Distance:   Bilateral Distance:    Right Eye Near:   Left Eye Near:    Bilateral Near:     Physical Exam Constitutional:      Appearance: Normal appearance.  HENT:     Head: Normocephalic and atraumatic.  Eyes:     Extraocular Movements: Extraocular movements intact.     Conjunctiva/sclera: Conjunctivae normal.  Cardiovascular:     Rate and Rhythm: Normal rate and regular rhythm.     Pulses: Normal pulses.     Heart sounds: Normal heart sounds.  Pulmonary:     Effort: Pulmonary effort is normal. No respiratory distress.     Breath sounds: Normal breath sounds.  Abdominal:     General: Abdomen is flat. Bowel sounds are normal. There is no distension.  Palpations: Abdomen is soft.     Tenderness: There is no abdominal tenderness.  Musculoskeletal:        General: Normal range of motion.     Comments: No tenderness to palpation to back  Skin:    General: Skin is warm and dry.  Neurological:     General: No focal deficit present.     Mental Status: He is alert and oriented to person, place, and time. Mental status is at baseline.  Psychiatric:        Mood and Affect: Mood normal.        Behavior: Behavior normal.        Thought Content: Thought content normal.        Judgment: Judgment normal.     UC Treatments / Results  Labs (all labs ordered are listed, but only abnormal results are displayed) Labs Reviewed  POCT URINALYSIS DIP (MANUAL ENTRY) - Abnormal; Notable for the following components:      Result Value   Blood, UA trace-intact (*)    Leukocytes, UA Trace (*)    All other components within normal limits  URINE CULTURE  CYTOLOGY, (ORAL, ANAL, URETHRAL) ANCILLARY ONLY    EKG   Radiology No results found.  Procedures Procedures (including  critical care time)  Medications Ordered in UC Medications - No data to display  Initial Impression / Assessment and Plan / UC Course  I have reviewed the triage vital signs and the nursing notes.  Pertinent labs & imaging results that were available during my care of the patient were reviewed by me and considered in my medical decision making (see chart for details).     Urinalysis was not definitive for UTI. Will send urine culture and STD urethral swab to rule out infection. High suspicion for kidney stone. Prescribed patient flomax to help alleviate symptoms. Advised patient to strain urine and to go to the hospital if pain or symptoms worsen. Patient to refrain from any sexual activity until symptoms have resolved. Discussed strict return precautions. Patient verbalized understanding and is agreeable with plan.  Final Clinical Impressions(s) / UC Diagnoses   Final diagnoses:  Urinary frequency  Screening examination for venereal disease  Acute right-sided low back pain without sciatica     Discharge Instructions      Your urine culture and STD swab are pending. We will call if this is positive. Please go to the hospital if symptoms worsen.      ED Prescriptions     Medication Sig Dispense Auth. Provider   tamsulosin (FLOMAX) 0.4 MG CAPS capsule Take 1 capsule (0.4 mg total) by mouth daily for 14 days. 14 capsule Lance Muss, FNP      PDMP not reviewed this encounter.   Lance Muss, FNP 07/05/21 1946

## 2021-07-06 ENCOUNTER — Other Ambulatory Visit: Payer: Self-pay

## 2021-07-06 ENCOUNTER — Emergency Department (HOSPITAL_COMMUNITY): Payer: BC Managed Care – PPO

## 2021-07-06 ENCOUNTER — Emergency Department (HOSPITAL_COMMUNITY)
Admission: EM | Admit: 2021-07-06 | Discharge: 2021-07-06 | Disposition: A | Discharge status: Home / Self Care | Payer: BC Managed Care – PPO | Attending: Emergency Medicine | Admitting: Emergency Medicine

## 2021-07-06 ENCOUNTER — Encounter (HOSPITAL_COMMUNITY): Payer: Self-pay

## 2021-07-06 DIAGNOSIS — R1030 Lower abdominal pain, unspecified: Secondary | ICD-10-CM | POA: Insufficient documentation

## 2021-07-06 DIAGNOSIS — M545 Low back pain, unspecified: Secondary | ICD-10-CM | POA: Insufficient documentation

## 2021-07-06 DIAGNOSIS — R102 Pelvic and perineal pain: Secondary | ICD-10-CM

## 2021-07-06 DIAGNOSIS — R39198 Other difficulties with micturition: Secondary | ICD-10-CM | POA: Diagnosis not present

## 2021-07-06 LAB — CBC WITH DIFFERENTIAL/PLATELET
Abs Immature Granulocytes: 0.04 10*3/uL (ref 0.00–0.07)
Basophils Absolute: 0.1 10*3/uL (ref 0.0–0.1)
Basophils Relative: 1 %
Eosinophils Absolute: 0.1 10*3/uL (ref 0.0–0.5)
Eosinophils Relative: 1 %
HCT: 45.2 % (ref 39.0–52.0)
Hemoglobin: 15.3 g/dL (ref 13.0–17.0)
Immature Granulocytes: 0 %
Lymphocytes Relative: 22 %
Lymphs Abs: 2.1 10*3/uL (ref 0.7–4.0)
MCH: 33.1 pg (ref 26.0–34.0)
MCHC: 33.8 g/dL (ref 30.0–36.0)
MCV: 97.8 fL (ref 80.0–100.0)
Monocytes Absolute: 0.6 10*3/uL (ref 0.1–1.0)
Monocytes Relative: 6 %
Neutro Abs: 6.6 10*3/uL (ref 1.7–7.7)
Neutrophils Relative %: 70 %
Platelets: 286 10*3/uL (ref 150–400)
RBC: 4.62 MIL/uL (ref 4.22–5.81)
RDW: 12.5 % (ref 11.5–15.5)
WBC: 9.3 10*3/uL (ref 4.0–10.5)
nRBC: 0 % (ref 0.0–0.2)

## 2021-07-06 LAB — BASIC METABOLIC PANEL
Anion gap: 8 (ref 5–15)
BUN: 18 mg/dL (ref 6–20)
CO2: 28 mmol/L (ref 22–32)
Calcium: 9.9 mg/dL (ref 8.9–10.3)
Chloride: 104 mmol/L (ref 98–111)
Creatinine, Ser: 0.96 mg/dL (ref 0.61–1.24)
GFR, Estimated: >60 mL/min (ref >60)
Glucose, Bld: 96 mg/dL (ref 70–99)
Potassium: 4.2 mmol/L (ref 3.5–5.1)
Sodium: 140 mmol/L (ref 135–145)

## 2021-07-06 LAB — URINALYSIS, ROUTINE W REFLEX MICROSCOPIC
Bacteria, UA: NONE SEEN
Bilirubin Urine: NEGATIVE
Glucose, UA: NEGATIVE mg/dL
Hgb urine dipstick: NEGATIVE
Ketones, ur: NEGATIVE mg/dL
Leukocytes,Ua: NEGATIVE
Nitrite: NEGATIVE
Protein, ur: NEGATIVE mg/dL
Specific Gravity, Urine: 1.014 (ref 1.005–1.030)
pH: 6 (ref 5.0–8.0)

## 2021-07-06 MED ORDER — DOXYCYCLINE HYCLATE 100 MG PO CAPS: 100.0000 mg | ORAL_CAPSULE | Freq: Two times a day (BID) | ORAL | 0 refills | Status: AC

## 2021-07-06 NOTE — ED Provider Notes (Signed)
Emergency Medicine Provider Triage Evaluation Note  [name] Andrade , a 30 y.o. male  was evaluated in triage.  Pt complains of bilateral lower back pain x 3 days (R worse than > L). Pain now radiates to suprapubic area, worse on right side. Patient was seen at urgent care yesterday with back pain and urinary frequency, performed UA and STD swab, was discharged with flomax. Now reports decreased urination and dysuria. Had episode today of intense pain, patient pulled over due to fast heart rate, sweating, and near syncope. Patient has history of kidney surgery 10 years ago.   Review of Systems  Positive: Decreased urination, dysuria, bilateral low back pain, RLQ abdominal pain, near syncope Negative: Chest pain, shortness of breath, hematuria, fever, chills, nausea, vomiting  Physical Exam  BP (!) 161/92 (BP Location: Left Arm)   Pulse 83   Temp 98.2 F (36.8 C) (Oral)   Resp 16   Ht 5\' 8"  (1.727 m)   Wt 71.2 kg   SpO2 99%   BMI 23.87 kg/m  Gen:   Awake, no distress   Resp:  Normal effort  MSK:   Moves extremities without difficulty  Other:  No tenderness to palpation of bilateral back, mild tenderness to palpation of suprapubic area  Medical Decision Making  Medically screening exam initiated at 2:19 PM.  Appropriate orders placed.  [name] Andrade was informed that the remainder of the evaluation will be completed by another provider, this initial triage assessment does not replace that evaluation, and the importance of remaining in the ED until their evaluation is complete.     Jaquita Rector 07/06/21 1426    07/08/21, MD 07/06/21 2027

## 2021-07-06 NOTE — Discharge Instructions (Addendum)
Please take a few minutes to read these important discharge instructions below: You have been diagnosed with and treated for a presumed sexually transmitted infection such as chlamydia, gonorrhea, or trichomoniasis.  Chlamydia and gonorrhea are the most commonly reported communicable diseases in the [place]. They are especially common among people younger than 32 years of age. Infection without symptoms is common in men and women. Your partner may be infected but not display symptoms.  Complications of these infections without treatment include serious pelvic infections, ectopic pregnancy, and infertility. Other infections such as HIV or HPV, which can occur in patients with sexually transmitted infections, can lead to cancer or death. To minimize the spread of the infection, you should avoid sexual intercourse for 7 days or until symptoms have resolved.  To minimize the spread of the infection, you should advise your sexual partner(s) to be evaluated, tested and treated. This includes all sexual partners within the past 60 days or your last sexual partner if last contact was greater than 60 days.  To minimize the risk of reinfection, you should abstain from sexual intercourse until your sexual partners have been tested and treated.  Consistent condom use is important in preventing the spread of sexually transmitted infections.  Do you need to get re-tested? Studies reveal that infection with chlamydia or gonorrhea is common among those treated within the preceding several months. Testing for gonorrhea and chlamydia in approximately 3 months is recommended even if you believe your sexual partner has been treated. Based on your history or demographics, your doctor may feel that you are higher risk for HIV and syphilis.  Talk to your doctor about whether you should be tested for these diseases at this time.  When should you come back to the Emergency Department? If your symptoms have not  improved after 48 hours from when you received your antibiotics, you should contact your primary care doctor or return to the Emergency Department.  These may be signs of an infection that has not been fully treated. If you develop worsening pain, worsening discharge from your penis or vagina, fevers of greater than 100.4 F, or have any other new or alarming symptoms, you should return promptly to the Emergency Department.  You should follow up with your primary care doctor's office in 1 week to ensure that your symptoms have resolved.  

## 2021-07-06 NOTE — ED Triage Notes (Signed)
Patient reports that he has been having bilateral lower back pain R>L. Patient reports that he had kidney surgery 10 years ago. Patient went to [facility] yesterday and was prescribed Flomax. Patient states he did have urinary frequency, but now he states that he feels like he needs to urinate and has a weaker stream.

## 2021-07-06 NOTE — ED Provider Notes (Signed)
[facility]-EMERGENCY DEPT Provider Note   CSN: [id number] Arrival date & time: 07/06/21  1341     History Chief Complaint  Patient presents with   Back Pain   Urinary Retention    [name] Andrade is a 30 y.o. male with a history of renal surgery 10 years ago presenting the emergency department with bilateral flank pain and suprapubic discomfort.  Patient reports onset of symptoms about 3 days ago.  He says it feels similar to the "kidney pain" that he has had in the past, but has not had for many years.  He has not seen a urologist since his surgery.  He reports that he had bilateral lower back pressure sensation, and subsequently became a sensation of difficulty with urination and suprapubic pressure.  He went to an urgent care yesterday and had an STI swab sent as well as a UA, and was discharged home with Flomax.  He has not been taking any other medications including over-the-counter meds.  Reports his pain is mild to moderate intensity.  It is worse with palpation of the suprapubic region.  He has never had it before.  Nothing makes it better or worse.  He denies nausea, vomiting, constipation.  He denies any other history of abdominal surgery.  He reports he is otherwise healthy.  He does report that he has had multiple sexual partners and has unprotected sex.  He does have a history of both gonorrhea and chlamydia in the past.  He denies any penile discharge or drainage or lesions.  He denies testicular pain.    HPI     Past Medical History:  Diagnosis Date   UPJ obstruction, acquired     There are no problems to display for this patient.   Past Surgical History:  Procedure Laterality Date   KIDNEY SURGERY Right    WISDOM TOOTH EXTRACTION         Family History  Problem Relation Age of Onset   Hypertension Mother     Social History   Tobacco Use   Smoking status: Never   Smokeless tobacco: Never  Vaping Use   Vaping Use: Never used   Substance Use Topics   Alcohol use: Yes   Drug use: Yes    Types: Marijuana    Home Medications Prior to Admission medications   Medication Sig Start Date End Date Taking? Authorizing Provider  doxycycline (VIBRAMYCIN) 100 MG capsule Take 1 capsule (100 mg total) by mouth 2 (two) times daily for 7 days. Start taking this antibiotic if you test POSITIVE for chlamydia 07/06/21 07/13/21 Yes Marvine Encalade, Kermit Balo, MD  tamsulosin (FLOMAX) 0.4 MG CAPS capsule Take 1 capsule (0.4 mg total) by mouth daily for 14 days. 07/05/21 07/19/21  Lance Muss, FNP    Allergies    Penicillins  Review of Systems   Review of Systems  Constitutional:  Negative for chills and fever.  Eyes:  Negative for photophobia and visual disturbance.  Respiratory:  Negative for cough and shortness of breath.   Cardiovascular:  Negative for chest pain and palpitations.  Gastrointestinal:  Negative for abdominal pain and vomiting.  Genitourinary:  Positive for difficulty urinating, flank pain and urgency. Negative for dysuria, genital sores, hematuria, penile discharge, penile pain, penile swelling, scrotal swelling and testicular pain.  Musculoskeletal:  Negative for arthralgias and back pain.  Skin:  Negative for color change and rash.  Neurological:  Negative for syncope and headaches.  All other systems reviewed and are  negative.  Physical Exam Updated Vital Signs BP (!) 148/97   Pulse 95   Temp 98.2 F (36.8 C) (Oral)   Resp 20   Ht 5\' 8"  (1.727 m)   Wt 71.2 kg   SpO2 100%   BMI 23.87 kg/m   Physical Exam Constitutional:      General: He is not in acute distress. HENT:     Head: Normocephalic and atraumatic.  Eyes:     Conjunctiva/sclera: Conjunctivae normal.     Pupils: Pupils are equal, round, and reactive to light.  Cardiovascular:     Rate and Rhythm: Normal rate and regular rhythm.     Pulses: Normal pulses.  Pulmonary:     Effort: Pulmonary effort is normal. No respiratory distress.   Abdominal:     General: Abdomen is flat. There is no distension.     Palpations: Abdomen is soft.     Tenderness: There is no abdominal tenderness. There is no right CVA tenderness, left CVA tenderness, guarding or rebound.  Skin:    General: Skin is warm and dry.  Neurological:     General: No focal deficit present.     Mental Status: He is alert. Mental status is at baseline.  Psychiatric:        Mood and Affect: Mood normal.        Behavior: Behavior normal.    ED Results / Procedures / Treatments   Labs (all labs ordered are listed, but only abnormal results are displayed) Labs Reviewed  URINALYSIS, ROUTINE W REFLEX MICROSCOPIC  BASIC METABOLIC PANEL  CBC WITH DIFFERENTIAL/PLATELET    EKG None  Radiology CT Abdomen Pelvis Wo Contrast  Result Date: 07/06/2021 CLINICAL DATA:  Right flank pain, hematuria, urinary frequency EXAM: CT ABDOMEN AND PELVIS WITHOUT CONTRAST TECHNIQUE: Multidetector CT imaging of the abdomen and pelvis was performed following the standard protocol without IV contrast. Unenhanced CT was performed per clinician order. Lack of IV contrast limits sensitivity and specificity, especially for evaluation of abdominal/pelvic solid viscera. COMPARISON:  06/01/2016 FINDINGS: Lower chest: No acute pleural or parenchymal lung disease. Hepatobiliary: No focal liver abnormality is seen. No gallstones, gallbladder wall thickening, or biliary dilatation. Pancreas: Unremarkable. No pancreatic ductal dilatation or surrounding inflammatory changes. Spleen: Normal in size without focal abnormality. Adrenals/Urinary Tract: There is diffuse right renal atrophy with multifocal right renal cortical scarring. A hyperdense lesion within the upper pole right kidney seen on previous study has resolved or been removed in the interim. Benign-appearing cortical calcifications are seen within the upper pole right kidney. There is mild bilateral renal medullary nephrocalcinosis again noted,  with punctate less than 2 mm bilateral nonobstructing renal calculi unchanged. No evidence of obstructive uropathy within either kidney. 2.6 cm hypodensity lower pole left kidney not appreciably changed since prior study, likely a cyst. The adrenals and bladder are unremarkable. Stomach/Bowel: No bowel obstruction or ileus. Normal appendix right lower quadrant. No bowel wall thickening or inflammatory change. Vascular/Lymphatic: No significant vascular findings are present. No enlarged abdominal or pelvic lymph nodes. Reproductive: Prostate is unremarkable. Other: No free fluid or free gas.  No abdominal wall hernia. Musculoskeletal: No acute or destructive bony lesions. Reconstructed images demonstrate no additional findings. IMPRESSION: 1. Stable punctate bilateral less than 2 mm nonobstructing renal calculi. 2. Bilateral renal medullary nephrocalcinosis. 3. Right renal cortical scarring and atrophy. 4. Stable left renal cyst. The hyperdense cyst in the upper pole right kidney seen on prior study has been removed or has resolved in the interim.  5. Otherwise unremarkable unenhanced CT of the abdomen and pelvis. Electronically Signed   By: Sharlet Salina M.D.   On: 07/06/2021 15:30    Procedures Procedures   Medications Ordered in ED Medications - No data to display  ED Course  I have reviewed the triage vital signs and the nursing notes.  Pertinent labs & imaging results that were available during my care of the patient were reviewed by me and considered in my medical decision making (see chart for details).  Differential diagnosis includes UTI versus STI versus bladder spasm versus kidney stone versus other.  He has a benign abdominal exam.  No rigidity or guarding.  No flank tenderness reproduced on exam.  Have a low suspicion for bowel perforation, appendicitis, pyelonephritis.  Labs and UA reviewed.  These are unremarkable.  Normal white blood cell count, UA without sign of infection.  CT scan  of the abdomen ordered by triage provider.  I reviewed these images.  He has chronic renal scarring, a small cyst in the left kidney, no focal findings to explain his symptoms.  He does not appear to be passing a kidney stone.  Do not see signs of appendicitis on the scan.  Clinically this presentation is not consistent with torsion.  We talked about the possibility of an STI.  I will provide him a doxycycline prescription for watch and wait at home.  He will follow-up on his results.  If he is positive for chlamydia we will start taking this medication.  If he is positive for gonorrhea I told him he will need to come back for an injection or treatment at the urgent care.  I also provided him urology phone number advised that he schedule a follow-up appointment with them that he establish care with them given his history, for further renal monitoring.     Final Clinical Impression(s) / ED Diagnoses Final diagnoses:  Suprapubic discomfort    Rx / DC Orders ED Discharge Orders          Ordered    doxycycline (VIBRAMYCIN) 100 MG capsule  2 times daily        07/06/21 1643             Zayli Villafuerte, Kermit Balo, MD 07/06/21 1649

## 2021-07-08 LAB — URINE CULTURE: Culture: NO GROWTH

## 2021-07-10 LAB — CYTOLOGY, (ORAL, ANAL, URETHRAL) ANCILLARY ONLY
Chlamydia: NEGATIVE
Comment: NEGATIVE
Comment: NORMAL
Neisseria Gonorrhea: NEGATIVE

## 2024-01-15 ENCOUNTER — Encounter (HOSPITAL_BASED_OUTPATIENT_CLINIC_OR_DEPARTMENT_OTHER): Payer: Self-pay

## 2024-01-15 ENCOUNTER — Other Ambulatory Visit: Payer: Self-pay

## 2024-01-15 DIAGNOSIS — K625 Hemorrhage of anus and rectum: Secondary | ICD-10-CM | POA: Diagnosis not present

## 2024-01-15 DIAGNOSIS — R109 Unspecified abdominal pain: Secondary | ICD-10-CM | POA: Insufficient documentation

## 2024-01-15 LAB — CBC WITH DIFFERENTIAL/PLATELET
Abs Immature Granulocytes: 0.02 10*3/uL (ref 0.00–0.07)
Basophils Absolute: 0.1 10*3/uL (ref 0.0–0.1)
Basophils Relative: 1 %
Eosinophils Absolute: 0.2 10*3/uL (ref 0.0–0.5)
Eosinophils Relative: 2 %
HCT: 41.3 % (ref 39.0–52.0)
Hemoglobin: 13.8 g/dL (ref 13.0–17.0)
Immature Granulocytes: 0 %
Lymphocytes Relative: 37 %
Lymphs Abs: 3 10*3/uL (ref 0.7–4.0)
MCH: 31.3 pg (ref 26.0–34.0)
MCHC: 33.4 g/dL (ref 30.0–36.0)
MCV: 93.7 fL (ref 80.0–100.0)
Monocytes Absolute: 0.8 10*3/uL (ref 0.1–1.0)
Monocytes Relative: 9 %
Neutro Abs: 4.2 10*3/uL (ref 1.7–7.7)
Neutrophils Relative %: 51 %
Platelets: 290 10*3/uL (ref 150–400)
RBC: 4.41 MIL/uL (ref 4.22–5.81)
RDW: 12.3 % (ref 11.5–15.5)
WBC: 8.2 10*3/uL (ref 4.0–10.5)
nRBC: 0 % (ref 0.0–0.2)

## 2024-01-15 LAB — BASIC METABOLIC PANEL
Anion gap: 9 (ref 5–15)
BUN: 21 mg/dL — ABNORMAL HIGH (ref 6–20)
CO2: 25 mmol/L (ref 22–32)
Calcium: 9.6 mg/dL (ref 8.9–10.3)
Chloride: 105 mmol/L (ref 98–111)
Creatinine, Ser: 1.08 mg/dL (ref 0.61–1.24)
GFR, Estimated: >60 mL/min (ref >60)
Glucose, Bld: 111 mg/dL — ABNORMAL HIGH (ref 70–99)
Potassium: 3.4 mmol/L — ABNORMAL LOW (ref 3.5–5.1)
Sodium: 139 mmol/L (ref 135–145)

## 2024-01-15 NOTE — ED Triage Notes (Signed)
 Pt reports he is here today due to rectal bleeding on Monday. Pt report x2 episodes of bright red blood. Pt reports fair amount of blood noted. Pt denies having any rectal bleeding since. Pt also reports right flank pain. Pt reports he had surgery on the right kidney x10 years ago. Pt reports pain in that same area.

## 2024-01-16 ENCOUNTER — Emergency Department (HOSPITAL_BASED_OUTPATIENT_CLINIC_OR_DEPARTMENT_OTHER)
Admission: EM | Admit: 2024-01-16 | Discharge: 2024-01-16 | Disposition: A | Discharge status: Home / Self Care | Payer: BC Managed Care – PPO | Attending: Emergency Medicine | Admitting: Emergency Medicine

## 2024-01-16 ENCOUNTER — Emergency Department (HOSPITAL_BASED_OUTPATIENT_CLINIC_OR_DEPARTMENT_OTHER): Payer: BC Managed Care – PPO

## 2024-01-16 DIAGNOSIS — K625 Hemorrhage of anus and rectum: Secondary | ICD-10-CM

## 2024-01-16 DIAGNOSIS — R109 Unspecified abdominal pain: Secondary | ICD-10-CM

## 2024-01-16 LAB — URINALYSIS, ROUTINE W REFLEX MICROSCOPIC
Bilirubin Urine: NEGATIVE
Glucose, UA: NEGATIVE mg/dL
Hgb urine dipstick: NEGATIVE
Ketones, ur: NEGATIVE mg/dL
Leukocytes,Ua: NEGATIVE
Nitrite: NEGATIVE
Protein, ur: NEGATIVE mg/dL
Specific Gravity, Urine: 1.015 (ref 1.005–1.030)
pH: 6.5 (ref 5.0–8.0)

## 2024-01-16 NOTE — ED Provider Notes (Signed)
 [facility] EMERGENCY DEPARTMENT AT [facility] Provider Note   CSN: [id number] Arrival date & time: 01/15/24  2248     History  Chief Complaint  Patient presents with   Rectal Bleeding   Flank Pain    [name] Andrade is a 33 y.o. male.  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  This was performed approximately 10 years ago.  Patient presenting today with complaints of rectal bleeding and right sided abdominal pain.  He had 2 episodes of passing bright red blood per rectum on Monday, then began to experience discomfort to his right flank and right abdomen.  This concerned him due to his history.  He denies any fevers or chills.  No dysuria.  No aggravating or alleviating factors.  He denies having to strain to go to the bathroom or having passed any hard stool.  He denies hemorrhoids.  The history is provided by the patient.       Home Medications Prior to Admission medications   Not on File      Allergies    Penicillins    Review of Systems   Review of Systems  All other systems reviewed and are negative.   Physical Exam Updated Vital Signs BP (!) 134/92   Pulse 72   Temp 98 F (36.7 C)   Resp 18   SpO2 94%  Physical Exam Vitals and nursing note reviewed.  Constitutional:      General: He is not in acute distress.    Appearance: He is well-developed. He is not diaphoretic.  HENT:     Head: Normocephalic and atraumatic.  Cardiovascular:     Rate and Rhythm: Normal rate and regular rhythm.     Heart sounds: No murmur heard.    No friction rub.  Pulmonary:     Effort: Pulmonary effort is normal. No respiratory distress.     Breath sounds: Normal breath sounds. No wheezing or rales.  Abdominal:     General: Bowel sounds are normal. There is no distension.     Palpations: Abdomen is soft.     Tenderness: There is no abdominal tenderness.  Musculoskeletal:        General:  Normal range of motion.     Cervical back: Normal range of motion and neck supple.  Skin:    General: Skin is warm and dry.  Neurological:     Mental Status: He is alert and oriented to person, place, and time.     Coordination: Coordination normal.     ED Results / Procedures / Treatments   Labs (all labs ordered are listed, but only abnormal results are displayed) Labs Reviewed  BASIC METABOLIC PANEL - Abnormal; Notable for the following components:      Result Value   Potassium 3.4 (*)    Glucose, Bld 111 (*)    BUN 21 (*)    All other components within normal limits  CBC WITH DIFFERENTIAL/PLATELET  URINALYSIS, ROUTINE W REFLEX MICROSCOPIC    EKG None  Radiology No results found.  Procedures Procedures    Medications Ordered in ED Medications - No data to display  ED Course/ Medical Decision Making/ A&P  Patient is a 33 year old male with history of prior kidney surgery presenting with complaints of right flank pain.  He also reports 2 episodes of rectal bleeding earlier in the week.  Patient arrives here with stable vital signs and is afebrile.  He does have some right-sided CVA tenderness, but abdominal exam is benign.  Laboratory studies obtained including CBC and basic metabolic panel, both of which are unremarkable.  There is no leukocytosis.  Urinalysis is clear.  CT with renal protocol obtained showing no evidence for kidney stone or other acute intraabdominal process.  Patient to be discharged with NSAIDs, rest, pain medication, and follow-up as needed.  Final Clinical Impression(s) / ED Diagnoses Final diagnoses:  None    Rx / DC Orders ED Discharge Orders     None         Geoffery Lyons, MD 01/16/24 2306

## 2024-01-16 NOTE — Discharge Instructions (Signed)
 Begin taking Colace (equate stool softener) 100 mg twice daily.  This medication is available over-the-counter.  Follow-up with gastroenterology if your bleeding persists.  A referral has been placed to [facility] for you to call and make these arrangements.  Return to the ER if symptoms significantly worsen or change.
